# Patient Record
Sex: Female | Born: 2000 | Race: White | Hispanic: No | Marital: Single | Smoking: Never smoker
Health system: Southern US, Community
[De-identification: ages and names within clinical notes are randomized; demographics above are authoritative.]

---
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
## 2019-03-22 ENCOUNTER — Other Ambulatory Visit: Payer: Self-pay

## 2019-03-22 DIAGNOSIS — Z20822 Contact with and (suspected) exposure to covid-19: Secondary | ICD-10-CM

## 2019-03-23 LAB — NOVEL CORONAVIRUS, NAA: SARS-CoV-2, NAA: NOT DETECTED

## 2019-07-07 ENCOUNTER — Other Ambulatory Visit: Payer: Self-pay

## 2019-07-07 ENCOUNTER — Ambulatory Visit: Payer: 59 | Attending: Pediatrics

## 2019-07-07 DIAGNOSIS — Z20822 Contact with and (suspected) exposure to covid-19: Secondary | ICD-10-CM

## 2019-07-08 LAB — NOVEL CORONAVIRUS, NAA: SARS-CoV-2, NAA: NOT DETECTED

## 2019-07-25 ENCOUNTER — Ambulatory Visit: Payer: 59 | Attending: Internal Medicine

## 2019-07-25 DIAGNOSIS — Z20822 Contact with and (suspected) exposure to covid-19: Secondary | ICD-10-CM

## 2019-07-27 LAB — NOVEL CORONAVIRUS, NAA: SARS-CoV-2, NAA: NOT DETECTED

## 2020-01-02 ENCOUNTER — Ambulatory Visit: Payer: 59 | Attending: Internal Medicine

## 2024-02-04 ENCOUNTER — Encounter: Payer: Self-pay | Admitting: Advanced Practice Midwife
